# Patient Record
Sex: Male | Born: 1950 | Race: White | Hispanic: No | Marital: Married | Smoking: Former smoker
Health system: Southern US, Community
[De-identification: ages and names within clinical notes are randomized; demographics above are authoritative.]

## PROBLEM LIST (undated history)

## (undated) DIAGNOSIS — M1990 Unspecified osteoarthritis, unspecified site: Secondary | ICD-10-CM

## (undated) DIAGNOSIS — K219 Gastro-esophageal reflux disease without esophagitis: Secondary | ICD-10-CM

## (undated) DIAGNOSIS — I10 Essential (primary) hypertension: Secondary | ICD-10-CM

## (undated) HISTORY — PX: KNEE ARTHROSCOPY: SHX127

## (undated) HISTORY — PX: COLONOSCOPY: SHX174

## (undated) HISTORY — PX: CARPAL TUNNEL RELEASE: SHX101

---

## 2007-11-27 ENCOUNTER — Ambulatory Visit: Payer: Self-pay | Admitting: Unknown Physician Specialty

## 2010-12-05 ENCOUNTER — Ambulatory Visit: Payer: Self-pay | Admitting: Family Medicine

## 2012-02-17 ENCOUNTER — Ambulatory Visit: Payer: Self-pay | Admitting: Unknown Physician Specialty

## 2012-02-20 ENCOUNTER — Ambulatory Visit: Payer: Self-pay | Admitting: Unknown Physician Specialty

## 2012-12-31 ENCOUNTER — Ambulatory Visit: Payer: Self-pay | Admitting: Unknown Physician Specialty

## 2013-01-22 ENCOUNTER — Ambulatory Visit: Payer: Self-pay | Admitting: Unknown Physician Specialty

## 2013-03-01 ENCOUNTER — Ambulatory Visit: Payer: Self-pay | Admitting: Unknown Physician Specialty

## 2014-03-25 ENCOUNTER — Ambulatory Visit: Payer: Self-pay | Admitting: Gastroenterology

## 2014-03-26 LAB — PATHOLOGY REPORT

## 2015-03-01 NOTE — Op Note (Signed)
PATIENT NAMClover Trujillo:  Kurt Trujillo, Kurt Trujillo MR#:  [id number] DATE OF BIRTH:  19-Mar-1951  DATE OF PROCEDURE:  02/20/2012  PREOPERATIVE DIAGNOSIS: Carpal tunnel syndrome, right wrist.   POSTOPERATIVE DIAGNOSIS: Carpal tunnel syndrome, right wrist.   PROCEDURE PERFORMED: Open carpal tunnel release on the right.   SURGEON: Alda BertholdHarold B. Jadis Mika, Jr., M.D.   ANESTHESIA: General.   HISTORY: The patient had a long history of carpal tunnel-type symptoms referable to both wrists. The right had been more involved. His nerve conduction studies were consistent with moderate involvement of his carpal tunnel on the right and mild involvement on the left.   The patient was ultimately brought in for open carpal tunnel release on the right due to his failure to respond to conservative treatment.   DESCRIPTION OF PROCEDURE: The patient was taken to the operating room where a tourniquet was applied to the patient's right upper arm. Satisfactory general anesthesia was achieved. The right upper extremity was then prepped and draped in the usual fashion for a procedure about the wrist.   The right upper extremity was exsanguinated and the tourniquet was inflated. About a 2.5 cm incision was made over the volar aspect of the distal half of his transverse carpal ligament. Dissection was carried down through the subcutaneous tissue onto his palmar fascia. The distal edge of the transverse carpal ligament was identified and a hemostat was inserted underneath the ligament to protect the median nerve. I then divided the ligament in its entirety. The distal half was divided with a knife and the proximal half with scissors. Inspection of the carpal tunnel after release failed to reveal any mass or evidence for proliferative synovitis.   The tourniquet was released at this time. It had been up about 9 to 10 minutes. Bleeding was controlled with digital pressure. The wound was irrigated with GU irrigant. I did inject the wound edges with 7 to 8  mL of 0.5% Marcaine without epinephrine.   I then closed the wound with 4-0 nylon sutures in vertical mattress fashion. Betadine was applied to the wound followed by a sterile dressing. A fiberglass volar splint was applied to wrist and slightly dorsiflexed.   The patient was then awakened and transferred to his stretcher bed. He was taken to the recovery room in satisfactory condition. Blood loss was negligible. ____________________________ Alda BertholdHarold B. Bettymae Yott Jr., MD hbk:slb D: 02/20/2012 13:13:33 ET T: 02/20/2012 16:25:42 ET JOB#: [id number]  cc: Alda BertholdHarold B. Terrisa Curfman Jr., MD, <Dictator> Randon GoldsmithHAROLD B Laurens Matheny, Montez HagemanJR MD ELECTRONICALLY SIGNED 02/26/2012 21:32

## 2015-11-17 ENCOUNTER — Encounter: Payer: Self-pay | Admitting: *Deleted

## 2015-11-17 ENCOUNTER — Other Ambulatory Visit: Payer: Medicare Other

## 2015-11-17 NOTE — Patient Instructions (Signed)
  Your procedure is scheduled on: 11-25-15 [facility](WEDNESDAY) Report to [facility] SAME DAY SURGERY 2ND FLOOR To find out your arrival time please call [phone] between 1PM - 3PM on 11-24-15 (TUESDAY)  Remember: Instructions that are not followed completely may result in serious medical risk, up to and including death, or upon the discretion of your surgeon and anesthesiologist your surgery may need to be rescheduled.    _X___ 1. Do not eat food or drink liquids after midnight. No gum chewing or hard candies.     _X___ 2. No Alcohol for 24 hours before or after surgery.   ____ 3. Bring all medications with you on the day of surgery if instructed.    _X___ 4. Notify your doctor if there is any change in your medical condition     (cold, fever, infections).     Do not wear jewelry, make-up, hairpins, clips or nail polish.  Do not wear lotions, powders, or perfumes. You may wear deodorant.  Do not shave 48 hours prior to surgery. Men may shave face and neck.  Do not bring valuables to the hospital.    [facility] any belongings or valuables.               Contacts, dentures or bridgework may not be worn into surgery.  Leave your suitcase in the car. After surgery it may be brought to your room.  For patients admitted to the hospital, discharge time is determined by your  treatment team.   Patients discharged the day of surgery will not be allowed to drive home.   Please read over the following fact sheets that you were given:      _X___ Take these medicines the morning of surgery with A SIP OF WATER:    1. NIEFEDIPINE  2.   3.   4.  5.  6.  ____ Fleet Enema (as directed)   _X___ Use CHG Soap as directed  ____ Use inhalers on the day of surgery  ____ Stop metformin 2 days prior to surgery    ____ Take 1/2 of usual insulin dose the night before surgery and none on the morning of surgery.   ____ Stop Coumadin/Plavix/aspirin-PT STOPPED ASPIRIN ON  11-15-15  ____ Stop Anti-inflammatories-NO NSAIDS OR ASA PRODUCTS-TYLENOL OK TO TAKE   ____ Stop supplements until after surgery.    ____ Bring C-Pap to the hospital.

## 2015-11-18 ENCOUNTER — Other Ambulatory Visit: Payer: Medicare Other

## 2015-11-19 ENCOUNTER — Encounter
Admission: RE | Admit: 2015-11-19 | Discharge: 2015-11-19 | Disposition: A | Discharge status: Home / Self Care | Payer: Medicare Other | Source: Ambulatory Visit | Attending: Unknown Physician Specialty | Admitting: Unknown Physician Specialty

## 2015-11-19 DIAGNOSIS — Z01810 Encounter for preprocedural cardiovascular examination: Secondary | ICD-10-CM | POA: Insufficient documentation

## 2015-11-25 ENCOUNTER — Ambulatory Visit
Admission: RE | Admit: 2015-11-25 | Discharge: 2015-11-25 | Disposition: A | Discharge status: Home / Self Care | Payer: Medicare Other | Source: Ambulatory Visit | Attending: Unknown Physician Specialty | Admitting: Unknown Physician Specialty

## 2015-11-25 ENCOUNTER — Ambulatory Visit: Payer: Medicare Other | Admitting: Certified Registered Nurse Anesthetist

## 2015-11-25 ENCOUNTER — Encounter
Admission: RE | Disposition: A | Discharge status: Home / Self Care | Payer: Self-pay | Source: Ambulatory Visit | Attending: Unknown Physician Specialty

## 2015-11-25 DIAGNOSIS — Z87891 Personal history of nicotine dependence: Secondary | ICD-10-CM | POA: Insufficient documentation

## 2015-11-25 DIAGNOSIS — Z7982 Long term (current) use of aspirin: Secondary | ICD-10-CM | POA: Diagnosis not present

## 2015-11-25 DIAGNOSIS — I10 Essential (primary) hypertension: Secondary | ICD-10-CM | POA: Diagnosis not present

## 2015-11-25 DIAGNOSIS — G5602 Carpal tunnel syndrome, left upper limb: Secondary | ICD-10-CM | POA: Insufficient documentation

## 2015-11-25 DIAGNOSIS — Z86010 Personal history of colonic polyps: Secondary | ICD-10-CM | POA: Diagnosis not present

## 2015-11-25 DIAGNOSIS — Z79899 Other long term (current) drug therapy: Secondary | ICD-10-CM | POA: Insufficient documentation

## 2015-11-25 HISTORY — DX: Essential (primary) hypertension: I10

## 2015-11-25 HISTORY — PX: CARPAL TUNNEL RELEASE: SHX101

## 2015-11-25 SURGERY — CARPAL TUNNEL RELEASE
Anesthesia: General | Laterality: Left

## 2015-11-25 MED ORDER — MIDAZOLAM HCL 2 MG/2ML IJ SOLN
INTRAMUSCULAR | Status: DC | PRN
  Administered 2015-11-25: 2 mg via INTRAVENOUS

## 2015-11-25 MED ORDER — FAMOTIDINE 20 MG PO TABS
20.0000 mg | ORAL_TABLET | Freq: Once | ORAL | Status: AC
  Administered 2015-11-25: 20 mg via ORAL

## 2015-11-25 MED ORDER — OXYCODONE HCL 5 MG PO TABS: 5.0000 mg | ORAL_TABLET | Freq: Once | ORAL | Status: DC | PRN

## 2015-11-25 MED ORDER — ONDANSETRON HCL 4 MG/2ML IJ SOLN
INTRAMUSCULAR | Status: DC | PRN
  Administered 2015-11-25: 4 mg via INTRAVENOUS

## 2015-11-25 MED ORDER — LACTATED RINGERS IV SOLN
INTRAVENOUS | Status: DC
  Administered 2015-11-25: 11:00:00 via INTRAVENOUS

## 2015-11-25 MED ORDER — BUPIVACAINE HCL (PF) 0.5 % IJ SOLN
INTRAMUSCULAR | Status: DC | PRN
  Administered 2015-11-25: 6 mL

## 2015-11-25 MED ORDER — HYDROCODONE-ACETAMINOPHEN 5-325 MG PO TABS: 1.0000 | ORAL_TABLET | ORAL | Status: DC | PRN

## 2015-11-25 MED ORDER — FENTANYL CITRATE (PF) 100 MCG/2ML IJ SOLN
INTRAMUSCULAR | Status: DC | PRN
  Administered 2015-11-25 (×2): 50 ug via INTRAVENOUS

## 2015-11-25 MED ORDER — OXYCODONE HCL 5 MG/5ML PO SOLN: 5.0000 mg | Freq: Once | ORAL | Status: DC | PRN

## 2015-11-25 MED ORDER — BUPIVACAINE HCL (PF) 0.5 % IJ SOLN
INTRAMUSCULAR | Status: AC
  Filled 2015-11-25: qty 30

## 2015-11-25 MED ORDER — FENTANYL CITRATE (PF) 100 MCG/2ML IJ SOLN: 25.0000 ug | INTRAMUSCULAR | Status: DC | PRN

## 2015-11-25 MED ORDER — FAMOTIDINE 20 MG PO TABS
ORAL_TABLET | ORAL | Status: AC
  Administered 2015-11-25: 20 mg via ORAL
  Filled 2015-11-25: qty 1

## 2015-11-25 MED ORDER — PROPOFOL 10 MG/ML IV BOLUS
INTRAVENOUS | Status: DC | PRN
  Administered 2015-11-25: 200 mg via INTRAVENOUS

## 2015-11-25 SURGICAL SUPPLY — 24 items
BANDAGE ELASTIC 2 CLIP NS LF (GAUZE/BANDAGES/DRESSINGS) ×3 IMPLANT
BNDG ESMARK 4X12 TAN STRL LF (GAUZE/BANDAGES/DRESSINGS) ×3 IMPLANT
CHLORAPREP W/TINT 26ML (MISCELLANEOUS) ×3 IMPLANT
CUFF TOURN 18 STER (MISCELLANEOUS) ×3 IMPLANT
ELECT REM PT RETURN 9FT ADLT (ELECTROSURGICAL) ×3
ELECTRODE REM PT RTRN 9FT ADLT (ELECTROSURGICAL) ×1 IMPLANT
GAUZE SPONGE 4X4 12PLY STRL (GAUZE/BANDAGES/DRESSINGS) ×3 IMPLANT
GLOVE BIO SURGEON STRL SZ7.5 (GLOVE) ×3 IMPLANT
GLOVE BIO SURGEON STRL SZ8 (GLOVE) ×6 IMPLANT
GLOVE BIOGEL M STRL SZ7.5 (GLOVE) ×3 IMPLANT
GLOVE INDICATOR 8.0 STRL GRN (GLOVE) ×3 IMPLANT
GOWN STRL REUS W/ TWL LRG LVL3 (GOWN DISPOSABLE) ×2 IMPLANT
GOWN STRL REUS W/TWL LRG LVL3 (GOWN DISPOSABLE) ×4
NS IRRIG 500ML POUR BTL (IV SOLUTION) ×3 IMPLANT
PACK EXTREMITY ARMC (MISCELLANEOUS) ×3 IMPLANT
PADDING CAST 2X4YD ST (MISCELLANEOUS) ×2
PADDING CAST BLEND 2X4 STRL (MISCELLANEOUS) ×1 IMPLANT
SOL PREP PVP 2OZ (MISCELLANEOUS) ×3
SOLUTION PREP PVP 2OZ (MISCELLANEOUS) ×1 IMPLANT
SPLINT CAST 1 STEP 3X12 (MISCELLANEOUS) ×3 IMPLANT
STOCKINETTE STRL 4IN 9604848 (GAUZE/BANDAGES/DRESSINGS) ×3 IMPLANT
SUT ETHILON 4-0 (SUTURE) ×2
SUT ETHILON 4-0 FS2 18XMFL BLK (SUTURE) ×1
SUTURE ETHLN 4-0 FS2 18XMF BLK (SUTURE) ×1 IMPLANT

## 2015-11-25 NOTE — Anesthesia Preprocedure Evaluation (Signed)
Anesthesia Evaluation  Patient identified by MRN, date of birth, ID band Patient awake    Reviewed: Allergy & Precautions, H&P , NPO status , Patient's Chart, lab work & pertinent test results  History of Anesthesia Complications Negative for: history of anesthetic complications  Airway Mallampati: II  TM Distance: >3 FB Neck ROM: limited    Dental  (+) Poor Dentition, Chipped   Pulmonary neg shortness of breath, former smoker,    Pulmonary exam normal breath sounds clear to auscultation       Cardiovascular Exercise Tolerance: Good hypertension, (-) angina(-) Past MI and (-) DOE Normal cardiovascular exam Rhythm:regular Rate:Normal     Neuro/Psych negative neurological ROS  negative psych ROS   GI/Hepatic negative GI ROS, Neg liver ROS, neg GERD  ,  Endo/Other  negative endocrine ROS  Renal/GU negative Renal ROS  negative genitourinary   Musculoskeletal   Abdominal   Peds  Hematology negative hematology ROS (+)   Anesthesia Other Findings Past Medical History:   Hypertension                                                Past Surgical History:   COLONOSCOPY                                                   CARPAL TUNNEL RELEASE                           Right              KNEE ARTHROSCOPY                                Right             BMI    Body Mass Index   36.01 kg/m 2    Signs and symptoms suggestive of sleep apnea     Reproductive/Obstetrics negative OB ROS                             Anesthesia Physical Anesthesia Plan  ASA: III  Anesthesia Plan: General LMA   Post-op Pain Management:    Induction:   Airway Management Planned:   Additional Equipment:   Intra-op Plan:   Post-operative Plan:   Informed Consent: I have reviewed the patients History and Physical, chart, labs and discussed the procedure including the risks, benefits and alternatives for the  proposed anesthesia with the patient or authorized representative who has indicated his/her understanding and acceptance.   Dental Advisory Given  Plan Discussed with: Anesthesiologist, CRNA and Surgeon  Anesthesia Plan Comments:         Anesthesia Quick Evaluation

## 2015-11-25 NOTE — Op Note (Signed)
DATE OF SURGERY:  11/25/2015  PATIENT NAME:  Kurt Trujillo   DOB: 1950/11/14  MRN: [id number]  PRE-OPERATIVE DIAGNOSIS: Left carpal tunnel syndrome  POST-OPERATIVE DIAGNOSIS:  Same  PROCEDURE: Left carpal tunnel release  SURGEON: Dr. Erin Sons, Montez Hageman. M.D.  ANESTHESIA: Gen.   INDICATIONS FOR SURGERY: Kurt Trujillo is a 65 y.o. year old male with a long history of numbness and paresthesias in the left hand. Nerve conduction studies demonstrated findings consistent with severe  median nerve compression.The patient had not seen any significant improvement despite conservative nonsurgical intervention. After discussion of the risks and benefits of surgical intervention, the patient expressed understanding of the risks benefits and agree with plans for carpal tunnel release.   PROCEDURE IN DETAIL: The patient was brought into the operating room and then satisfactory general anesthesia was achieved. A tourniquet was placed on the patient's left upper arm.The left hand and arm were prepped  and draped in the usual sterile fashion. A "time-out" was performed as per usual protocol. The hand and forearm were exsanguinated using an Esmarch and the tourniquet was inflated to 250 mmHg.  An incision was made just ulnar to the thenar palmar crease. Dissection was carried down through the palmar fascia to the transverse carpal ligament. The transverse carpal ligament was sharply incised, taking care to protect the underlying structures within the carpal tunnel. Complete release of the transverse carpal ligament was achieved. There was no evidence of a mass or proliferative synovitis within the carpal tunnel. The patient's median nerve was quite flattened, however. The wound was irrigated with saline. The tourniquet was released at this time. It had been up for about 11 minutes. Bleeding was controlled with coagulation cautery. I did inject the subcutaneous tissue of the wound with about 10 cc of 0.5% Marcaine  without epinephrine. The skin was then re-approximated with interrupted sutures of #4-0 nylon. A sterile dressing was applied followed by application of a volar splint.  The patient tolerated the procedure well and was transported to the PACU in stable condition.  Dr. Isidoro Donning. M.D.

## 2015-11-25 NOTE — Anesthesia Postprocedure Evaluation (Signed)
Anesthesia Post Note  Patient: Kurt Trujillo  Procedure(s) Performed: Procedure(s) (LRB): CARPAL TUNNEL RELEASE (Left)  Patient location during evaluation: PACU Anesthesia Type: General Level of consciousness: awake and alert Pain management: pain level controlled Vital Signs Assessment: post-procedure vital signs reviewed and stable Respiratory status: spontaneous breathing, nonlabored ventilation, respiratory function stable and patient connected to nasal cannula oxygen Cardiovascular status: blood pressure returned to baseline and stable Postop Assessment: no signs of nausea or vomiting Anesthetic complications: no    Last Vitals:  Filed Vitals:   11/25/15 1250 11/25/15 1305  BP: 130/91 132/90  Pulse: 64 62  Temp:    Resp: 13 14    Last Pain: There were no vitals filed for this visit.               Cleda Mccreedy Piscitello

## 2015-11-25 NOTE — Anesthesia Procedure Notes (Signed)
Procedure Name: LMA Insertion Performed by: Almeta Monas Pre-anesthesia Checklist: Patient identified, Patient being monitored, Timeout performed, Emergency Drugs available and Suction available Patient Re-evaluated:Patient Re-evaluated prior to inductionOxygen Delivery Method: Circle system utilized Preoxygenation: Pre-oxygenation with 100% oxygen Intubation Type: IV induction LMA: LMA inserted LMA Size: 5.0 Tube type: Oral Number of attempts: 1 Placement Confirmation: positive ETCO2 and breath sounds checked- equal and bilateral Tube secured with: Tape Dental Injury: Teeth and Oropharynx as per pre-operative assessment

## 2015-11-25 NOTE — H&P (Signed)
  H and P reviewed. No changes. Uploaded at later date. 

## 2015-11-25 NOTE — Discharge Instructions (Signed)
Ice pack  Elevation  Keep dressing dry   RTC in about 10 days

## 2015-11-25 NOTE — Transfer of Care (Signed)
Immediate Anesthesia Transfer of Care Note  Patient: Kurt Trujillo  Procedure(s) Performed: Procedure(s): CARPAL TUNNEL RELEASE (Left)  Patient Location: PACU  Anesthesia Type:General  Level of Consciousness: awake, alert  and oriented  Airway & Oxygen Therapy: Patient Spontanous Breathing  Post-op Assessment: Report given to RN and Post -op Vital signs reviewed and stable  Post vital signs: Reviewed and stable  Last Vitals:  Filed Vitals:   11/25/15 1013 11/25/15 1235  BP: 135/87 148/98  Pulse: 83 82  Temp: 36 C 36.5 C  Resp: 16 22    Complications: No apparent anesthesia complications

## 2017-09-07 ENCOUNTER — Other Ambulatory Visit: Payer: Self-pay | Admitting: Unknown Physician Specialty

## 2017-09-07 DIAGNOSIS — M25511 Pain in right shoulder: Secondary | ICD-10-CM

## 2017-09-13 ENCOUNTER — Ambulatory Visit
Admission: RE | Admit: 2017-09-13 | Discharge: 2017-09-13 | Disposition: A | Discharge status: Home / Self Care | Payer: Medicare Other | Source: Ambulatory Visit | Attending: Unknown Physician Specialty | Admitting: Unknown Physician Specialty

## 2017-09-13 DIAGNOSIS — M25511 Pain in right shoulder: Secondary | ICD-10-CM

## 2017-09-13 DIAGNOSIS — M75121 Complete rotator cuff tear or rupture of right shoulder, not specified as traumatic: Secondary | ICD-10-CM | POA: Insufficient documentation

## 2017-09-13 DIAGNOSIS — M19011 Primary osteoarthritis, right shoulder: Secondary | ICD-10-CM | POA: Diagnosis not present

## 2017-09-13 DIAGNOSIS — M7551 Bursitis of right shoulder: Secondary | ICD-10-CM | POA: Diagnosis not present

## 2017-09-22 ENCOUNTER — Other Ambulatory Visit: Payer: Self-pay | Admitting: Orthopedic Surgery

## 2017-09-22 DIAGNOSIS — M25511 Pain in right shoulder: Secondary | ICD-10-CM

## 2017-10-02 ENCOUNTER — Ambulatory Visit: Payer: Medicare Other

## 2017-10-03 ENCOUNTER — Ambulatory Visit: Payer: Medicare Other

## 2017-11-01 ENCOUNTER — Encounter
Admission: RE | Admit: 2017-11-01 | Discharge: 2017-11-01 | Disposition: A | Discharge status: Home / Self Care | Payer: Medicare Other | Source: Ambulatory Visit | Attending: Surgery | Admitting: Surgery

## 2017-11-01 ENCOUNTER — Other Ambulatory Visit: Payer: Self-pay

## 2017-11-01 HISTORY — DX: Gastro-esophageal reflux disease without esophagitis: K21.9

## 2017-11-01 HISTORY — DX: Unspecified osteoarthritis, unspecified site: M19.90

## 2017-11-01 NOTE — Patient Instructions (Signed)
  Your procedure is scheduled on: 11-09-16 THURSDAY Report to Same Day Surgery 2nd floor medical mall [facility](Medical Mall Entrance-take elevator on left to 2nd floor.  Check in with surgery information desk.) To find out your arrival time please call [phone]) [phone] between 1PM - 3PM on 11-08-16 [facility]  Remember: Instructions that are not followed completely may result in serious medical risk, up to and including death, or upon the discretion of your surgeon and anesthesiologist your surgery may need to be rescheduled.    _x___ 1. Do not eat food after midnight the night before your procedure. NO GUM OR CANDY AFTER MIDNIGHT.  You may drink clear liquids up to 2 hours before you are scheduled to arrive at the hospital for your procedure.  Do not drink clear liquids within 2 hours of your scheduled arrival to the hospital.  Clear liquids include  --Water or Apple juice without pulp  --Clear carbohydrate beverage such as ClearFast or Gatorade  --Black Coffee or Clear Tea (No milk, no creamers, do not add anything to  the coffee or Tea    __x__ 2. No Alcohol for 24 hours before or after surgery.   __x__3. No Smoking for 24 prior to surgery.   ____  4. Bring all medications with you on the day of surgery if instructed.    __x__ 5. Notify your doctor if there is any change in your medical condition     (cold, fever, infections).     Do not wear jewelry, make-up, hairpins, clips or nail polish.  Do not wear lotions, powders, or perfumes. You may wear deodorant.  Do not shave 48 hours prior to surgery. Men may shave face and neck.  Do not bring valuables to the hospital.    [facility] not [facility] for any belongings or valuables.               Contacts, dentures or bridgework may not be worn into surgery.  Leave your suitcase in the car. After surgery it may be brought to your room.  For patients admitted to the hospital, discharge time is determined by your treatment team.   Patients  discharged the day of surgery will not be allowed to drive home.  You will need someone to drive you home and stay with you the night of your procedure.    Please read over the following fact sheets that you were given:   [facility] Pre[facility] and or MRSA Information   _x___ TAKE THE FOLLOWING MEDICATION THE MORNING OF SURGERY WITH A SMALL SIP OF WATER. These include:  1. PROCARDIA (NIFEDIPINE)  2.  3.  4.  5.  6.  ____Fleets enema or Magnesium Citrate as directed.   _x___ Use CHG Soap or sage wipes as directed on instruction sheet   ____ Use inhalers on the day of surgery and bring to hospital day of surgery  ____ Stop Metformin and Janumet 2 days prior to surgery.    ____ Take 1/2 of usual insulin dose the night before surgery and none on the morning surgery.   _x___ Follow recommendations from Cardiologist, Pulmonologist or PCP regarding stopping Aspirin, Coumadin, Plavix ,Eliquis, Effient, or Pradaxa, and Pletal-STOP ASPIRIN NOW  X____Stop Anti-inflammatories such as Advil, ALEVE, Ibuprofen, Motrin, Naproxen, Naprosyn, Goodies powders or aspirin products NOW-OK to take Tylenol    ____ Stop supplements until after surgery.    ____ Bring C-Pap to the hospital.

## 2017-11-02 ENCOUNTER — Encounter
Admission: RE | Admit: 2017-11-02 | Discharge: 2017-11-02 | Disposition: A | Discharge status: Home / Self Care | Payer: Medicare Other | Source: Ambulatory Visit | Attending: Surgery | Admitting: Surgery

## 2017-11-02 DIAGNOSIS — R9431 Abnormal electrocardiogram [ECG] [EKG]: Secondary | ICD-10-CM | POA: Insufficient documentation

## 2017-11-02 DIAGNOSIS — I10 Essential (primary) hypertension: Secondary | ICD-10-CM | POA: Diagnosis present

## 2017-11-08 MED ORDER — CEFAZOLIN SODIUM-DEXTROSE 2-4 GM/100ML-% IV SOLN
2.0000 g | Freq: Once | INTRAVENOUS | Status: AC
  Administered 2017-11-09: 2 g via INTRAVENOUS

## 2017-11-09 ENCOUNTER — Encounter
Admission: RE | Disposition: A | Discharge status: Home / Self Care | Payer: Self-pay | Source: Ambulatory Visit | Attending: Surgery

## 2017-11-09 ENCOUNTER — Other Ambulatory Visit: Payer: Self-pay

## 2017-11-09 ENCOUNTER — Ambulatory Visit
Admission: RE | Admit: 2017-11-09 | Discharge: 2017-11-09 | Disposition: A | Discharge status: Home / Self Care | Payer: Medicare Other | Source: Ambulatory Visit | Attending: Surgery | Admitting: Surgery

## 2017-11-09 ENCOUNTER — Ambulatory Visit: Payer: Medicare Other | Admitting: Anesthesiology

## 2017-11-09 ENCOUNTER — Encounter: Payer: Self-pay | Admitting: *Deleted

## 2017-11-09 DIAGNOSIS — I10 Essential (primary) hypertension: Secondary | ICD-10-CM | POA: Insufficient documentation

## 2017-11-09 DIAGNOSIS — M75101 Unspecified rotator cuff tear or rupture of right shoulder, not specified as traumatic: Secondary | ICD-10-CM | POA: Diagnosis not present

## 2017-11-09 DIAGNOSIS — Z87891 Personal history of nicotine dependence: Secondary | ICD-10-CM | POA: Diagnosis not present

## 2017-11-09 DIAGNOSIS — M13811 Other specified arthritis, right shoulder: Secondary | ICD-10-CM | POA: Insufficient documentation

## 2017-11-09 DIAGNOSIS — K219 Gastro-esophageal reflux disease without esophagitis: Secondary | ICD-10-CM | POA: Diagnosis not present

## 2017-11-09 DIAGNOSIS — Z7982 Long term (current) use of aspirin: Secondary | ICD-10-CM | POA: Insufficient documentation

## 2017-11-09 DIAGNOSIS — Z79899 Other long term (current) drug therapy: Secondary | ICD-10-CM | POA: Insufficient documentation

## 2017-11-09 DIAGNOSIS — M7521 Bicipital tendinitis, right shoulder: Secondary | ICD-10-CM | POA: Diagnosis not present

## 2017-11-09 SURGERY — SHOULDER ARTHROSCOPY WITH SUBACROMIAL DECOMPRESSION AND DISTAL CLAVICLE EXCISION
Anesthesia: General | Laterality: Right

## 2017-11-09 MED ORDER — ROCURONIUM BROMIDE 50 MG/5ML IV SOLN
INTRAVENOUS | Status: AC
  Filled 2017-11-09: qty 2

## 2017-11-09 MED ORDER — EPINEPHRINE PF 1 MG/ML IJ SOLN
INTRAMUSCULAR | Status: DC | PRN
  Administered 2017-11-09 (×2): 1 mg

## 2017-11-09 MED ORDER — LIDOCAINE HCL (PF) 2 % IJ SOLN
INTRAMUSCULAR | Status: AC
  Filled 2017-11-09: qty 10

## 2017-11-09 MED ORDER — MIDAZOLAM HCL 2 MG/2ML IJ SOLN
2.0000 mg | Freq: Once | INTRAMUSCULAR | Status: AC
  Administered 2017-11-09: 2 mg via INTRAVENOUS

## 2017-11-09 MED ORDER — LIDOCAINE HCL (PF) 1 % IJ SOLN
INTRAMUSCULAR | Status: DC | PRN
  Administered 2017-11-09: .8 mL via SUBCUTANEOUS

## 2017-11-09 MED ORDER — FENTANYL CITRATE (PF) 100 MCG/2ML IJ SOLN
INTRAMUSCULAR | Status: DC | PRN
  Administered 2017-11-09: 75 ug via INTRAVENOUS
  Administered 2017-11-09: 100 ug via INTRAVENOUS
  Administered 2017-11-09: 75 ug via INTRAVENOUS

## 2017-11-09 MED ORDER — FENTANYL CITRATE (PF) 100 MCG/2ML IJ SOLN: 25.0000 ug | INTRAMUSCULAR | Status: DC | PRN

## 2017-11-09 MED ORDER — ROPIVACAINE HCL 5 MG/ML IJ SOLN
INTRAMUSCULAR | Status: DC | PRN
  Administered 2017-11-09: 30 mL via PERINEURAL

## 2017-11-09 MED ORDER — BUPIVACAINE-EPINEPHRINE (PF) 0.5% -1:200000 IJ SOLN
INTRAMUSCULAR | Status: DC | PRN
  Administered 2017-11-09: 30 mL via PERINEURAL

## 2017-11-09 MED ORDER — FAMOTIDINE 20 MG PO TABS
20.0000 mg | ORAL_TABLET | Freq: Once | ORAL | Status: AC
  Administered 2017-11-09: 20 mg via ORAL

## 2017-11-09 MED ORDER — LIDOCAINE HCL (CARDIAC) 20 MG/ML IV SOLN
INTRAVENOUS | Status: DC | PRN
  Administered 2017-11-09: 100 mg via INTRAVENOUS

## 2017-11-09 MED ORDER — PROPOFOL 10 MG/ML IV BOLUS
INTRAVENOUS | Status: DC | PRN
  Administered 2017-11-09: 30 mg via INTRAVENOUS
  Administered 2017-11-09: 170 mg via INTRAVENOUS

## 2017-11-09 MED ORDER — PROPOFOL 10 MG/ML IV BOLUS
INTRAVENOUS | Status: AC
  Filled 2017-11-09: qty 20

## 2017-11-09 MED ORDER — LACTATED RINGERS IV SOLN
INTRAVENOUS | Status: DC
  Administered 2017-11-09: 11:00:00 via INTRAVENOUS

## 2017-11-09 MED ORDER — FENTANYL CITRATE (PF) 100 MCG/2ML IJ SOLN
50.0000 ug | Freq: Once | INTRAMUSCULAR | Status: AC
  Administered 2017-11-09: 50 ug via INTRAVENOUS

## 2017-11-09 MED ORDER — FENTANYL CITRATE (PF) 250 MCG/5ML IJ SOLN
INTRAMUSCULAR | Status: AC
  Filled 2017-11-09: qty 5

## 2017-11-09 MED ORDER — SUGAMMADEX SODIUM 200 MG/2ML IV SOLN
INTRAVENOUS | Status: DC | PRN
  Administered 2017-11-09: 170 mg via INTRAVENOUS

## 2017-11-09 MED ORDER — DEXAMETHASONE SODIUM PHOSPHATE 4 MG/ML IJ SOLN
INTRAMUSCULAR | Status: DC | PRN
  Administered 2017-11-09: 6 mg via INTRAVENOUS

## 2017-11-09 MED ORDER — DEXMEDETOMIDINE HCL 200 MCG/2ML IV SOLN
INTRAVENOUS | Status: DC | PRN
  Administered 2017-11-09: 8 ug via INTRAVENOUS

## 2017-11-09 MED ORDER — ONDANSETRON HCL 4 MG/2ML IJ SOLN: 4.0000 mg | Freq: Once | INTRAMUSCULAR | Status: DC | PRN

## 2017-11-09 MED ORDER — ROCURONIUM BROMIDE 100 MG/10ML IV SOLN
INTRAVENOUS | Status: DC | PRN
  Administered 2017-11-09: 20 mg via INTRAVENOUS
  Administered 2017-11-09: 50 mg via INTRAVENOUS

## 2017-11-09 MED ORDER — OXYCODONE HCL 5 MG PO TABS: 5.0000 mg | ORAL_TABLET | ORAL | 0 refills | Status: AC | PRN

## 2017-11-09 SURGICAL SUPPLY — 47 items
BIT DRILL JUGRKNT W/NDL BIT2.9 (DRILL) ×2 IMPLANT
BLADE FULL RADIUS 3.5 (BLADE) ×3 IMPLANT
BUR ACROMIONIZER 4.0 (BURR) ×3 IMPLANT
CANNULA SHAVER 8MMX76MM (CANNULA) ×3 IMPLANT
CHLORAPREP W/TINT 26ML (MISCELLANEOUS) ×3 IMPLANT
COVER MAYO STAND STRL (DRAPES) IMPLANT
DRAPE IMP U-DRAPE 54X76 (DRAPES) ×6 IMPLANT
DRAPE POUCH INSTRU U-SHP 10X18 (DRAPES) ×3 IMPLANT
DRILL JUGGERKNOT W/NDL BIT 2.9 (DRILL) ×6
DRSG OPSITE POSTOP 4X8 (GAUZE/BANDAGES/DRESSINGS) ×3 IMPLANT
ELECT REM PT RETURN 9FT ADLT (ELECTROSURGICAL) ×3
ELECTRODE REM PT RTRN 9FT ADLT (ELECTROSURGICAL) ×1 IMPLANT
GAUZE PETRO XEROFOAM 1X8 (MISCELLANEOUS) ×3 IMPLANT
GAUZE SPONGE 4X4 12PLY STRL (GAUZE/BANDAGES/DRESSINGS) ×3 IMPLANT
GLOVE BIO SURGEON STRL SZ7.5 (GLOVE) ×6 IMPLANT
GLOVE BIO SURGEON STRL SZ8 (GLOVE) ×6 IMPLANT
GLOVE BIOGEL PI IND STRL 8 (GLOVE) ×1 IMPLANT
GLOVE BIOGEL PI INDICATOR 8 (GLOVE) ×2
GLOVE INDICATOR 8.0 STRL GRN (GLOVE) ×3 IMPLANT
GOWN STRL REUS W/ TWL LRG LVL3 (GOWN DISPOSABLE) ×1 IMPLANT
GOWN STRL REUS W/ TWL XL LVL3 (GOWN DISPOSABLE) ×1 IMPLANT
GOWN STRL REUS W/TWL LRG LVL3 (GOWN DISPOSABLE) ×2
GOWN STRL REUS W/TWL XL LVL3 (GOWN DISPOSABLE) ×2
GRASPER SUT 15 45D LOW PRO (SUTURE) IMPLANT
IV LACTATED RINGER IRRG 3000ML (IV SOLUTION) ×4
IV LR IRRIG 3000ML ARTHROMATIC (IV SOLUTION) ×2 IMPLANT
MANIFOLD NEPTUNE II (INSTRUMENTS) ×3 IMPLANT
MASK FACE SPIDER DISP (MASK) ×3 IMPLANT
MAT BLUE FLOOR 46X72 FLO (MISCELLANEOUS) ×3 IMPLANT
NDL MAYO CATGUT SZ5 (NEEDLE)
NDL SUT 5 .5 CRC TPR PNT MAYO (NEEDLE) IMPLANT
NEEDLE HYPO 22GX1.5 SAFETY (NEEDLE) ×3 IMPLANT
NEEDLE MAYO 6 CRC TAPER PT (NEEDLE) IMPLANT
NEEDLE REVERSE CUT 1/2 CRC (NEEDLE) IMPLANT
PACK ARTHROSCOPY SHOULDER (MISCELLANEOUS) ×3 IMPLANT
SLING ARM LRG DEEP (SOFTGOODS) IMPLANT
SLING ULTRA II LG (MISCELLANEOUS) IMPLANT
STAPLER SKIN PROX 35W (STAPLE) ×3 IMPLANT
STRAP SAFETY BODY (MISCELLANEOUS) ×6 IMPLANT
SUT ETHIBOND 0 MO6 C/R (SUTURE) ×3 IMPLANT
SUT VIC AB 2-0 CT1 27 (SUTURE) ×4
SUT VIC AB 2-0 CT1 TAPERPNT 27 (SUTURE) ×2 IMPLANT
TAPE MICROFOAM 4IN (TAPE) ×3 IMPLANT
TUBING ARTHRO INFLOW-ONLY STRL (TUBING) ×3 IMPLANT
TUBING CONNECTING 10 (TUBING) ×2 IMPLANT
TUBING CONNECTING 10' (TUBING) ×1
WAND HAND CNTRL MULTIVAC 90 (MISCELLANEOUS) ×3 IMPLANT

## 2017-11-09 NOTE — Transfer of Care (Signed)
Immediate Anesthesia Transfer of Care Note  Patient: Antony SalmonRonald D Steinberg  Procedure(s) Performed: SHOULDER ARTHROSCOPY WITH DEBRIDEMENT, DECOMPRESSION AND BICEPS TENODESIS (Right )  Patient Location: PACU  Anesthesia Type:General  Level of Consciousness: awake, alert , oriented and patient cooperative  Airway & Oxygen Therapy: Patient Spontanous Breathing and Patient connected to nasal cannula oxygen  Post-op Assessment: Report given to RN and Post -op Vital signs reviewed and stable  Post vital signs: Reviewed and stable  Last Vitals:  Vitals:   11/09/17 1211 11/09/17 1226  BP: 118/76 138/82  Pulse: 64 78  Resp: 17 19  Temp:    SpO2: 96% 98%    Last Pain:  Vitals:   11/09/17 1029  TempSrc: Oral         Complications: No apparent anesthesia complications

## 2017-11-09 NOTE — Anesthesia Postprocedure Evaluation (Signed)
Anesthesia Post Note  Patient: Kurt Trujillo  Procedure(s) Performed: SHOULDER ARTHROSCOPY WITH DEBRIDEMENT, DECOMPRESSION AND BICEPS TENODESIS (Right )  Patient location during evaluation: PACU Anesthesia Type: General Level of consciousness: awake and alert Pain management: pain level controlled Vital Signs Assessment: post-procedure vital signs reviewed and stable Respiratory status: spontaneous breathing and respiratory function stable Cardiovascular status: stable Anesthetic complications: no     Last Vitals:  Vitals:   11/09/17 1517 11/09/17 1612  BP: 127/74 140/74  Pulse: 67 74  Resp: 16 16  Temp: 36.6 C   SpO2: 96% 98%    Last Pain:  Vitals:   11/09/17 1517  TempSrc: Oral                 KEPHART,WILLIAM K

## 2017-11-09 NOTE — Anesthesia Procedure Notes (Signed)
Procedure Name: Intubation Date/Time: 11/09/2017 12:53 PM Performed by: Lily KocherPeralta, Khamari Yousuf, CRNA Pre-anesthesia Checklist: Patient identified, Patient being monitored, Timeout performed, Emergency Drugs available and Suction available Patient Re-evaluated:Patient Re-evaluated prior to induction Oxygen Delivery Method: Circle system utilized Preoxygenation: Pre-oxygenation with 100% oxygen Induction Type: IV induction Ventilation: Mask ventilation without difficulty Laryngoscope Size: Miller and 2 Grade View: Grade II Tube type: Oral Tube size: 7.5 mm Number of attempts: 1 Airway Equipment and Method: Stylet Placement Confirmation: ETT inserted through vocal cords under direct vision,  positive ETCO2 and breath sounds checked- equal and bilateral Secured at: 22 cm Tube secured with: Tape Dental Injury: Teeth and Oropharynx as per pre-operative assessment

## 2017-11-09 NOTE — Anesthesia Post-op Follow-up Note (Signed)
Anesthesia QCDR form completed.        

## 2017-11-09 NOTE — Op Note (Signed)
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  Post-Op Diagnosis: Same.  Procedure: Extensive arthroscopic debridement, arthroscopic subacromial decompression, and mini-open biceps tenodesis, right shoulder.  Anesthesia: General endotracheal with interscalene block placed preoperatively by the anesthesiologist.  Surgeon:   Maryagnes Amos, MD  Assistant:   Horris Latino, PA-C  Findings: As above.  There is a massive irreparable rotator cuff tear involving most of the subscapularis tendon, as well as all of the supraspinatus and infraspinatus tendons, with retraction of the supraspinatus and infraspinatus tendons back to the glenoid.  The biceps tendon is significantly frayed and tendon of pathic with medial subluxation out of the groove.  There are diffuse grade 1-2 chondromalacial changes of the humeral head and grade 2-3 chondral malacia changes of the glenoid.  Moderate labral fraying is noted circumferentially without frank detachment from the glenoid.  Complications: None  Fluids:   750 cc  Estimated blood loss: 5 cc  Tourniquet time: None  Drains: None  Closure: Staples   Brief clinical note: The patient is a 67 year old male with a long history of right shoulder pain. The patient's symptoms have progressed despite medications, activity modification, etc. The patient's history and examination are consistent with impingement/tendinopathy with a massive irreparable rotator cuff tear and biceps tendinopathy. These findings were confirmed by MRI scan. The patient presents at this time for definitive management of these shoulder symptoms.  Procedure: The patient underwent placement of an interscalene block by the anesthesiologist in the preoperative holding area before being brought into the operating room and lain in the supine  position. The patient then underwent general endotracheal intubation and anesthesia before being repositioned in the beach chair position using the beach chair positioner. The right shoulder and upper extremity were prepped with ChloraPrep solution before being draped sterilely. Preoperative antibiotics were administered. A timeout was performed to confirm the proper surgical site before the expected portal sites and incision site were injected with 0.5% Sensorcaine with epinephrine. A posterior portal was created and the glenohumeral joint thoroughly inspected with the findings as described above. An anterior portal was created using an outside-in technique. The labrum and rotator cuff were further probed, again confirming the above-noted findings. The areas of labral fraying were debrided back to stable margins using the full-radius resector. The areas of extensive synovitis also were debrided back to stable margins using the full-radius resector. Finally, there were areas of articular cartilage fraying that were debrided back to stable margins using the full-radius resector. The ArthroCare wand was inserted and used to release the biceps tendon from its labral anchor, as well as obtain hemostasis and to "anneal" the labrum superiorly and anteriorly. The instruments were removed from the joint after suctioning the excess fluid.  The camera was repositioned through the posterior portal into the subacromial space. A separate lateral portal was created using an outside-in technique. The 3.5 mm full-radius resector was introduced and used to perform a subtotal bursectomy. The ArthroCare wand was then inserted and used to remove the periosteal tissue off the undersurface of the anterior third of the acromion as well as to recess the coracoacromial ligament from its attachment along the anterior and lateral margins of the acromion. No significant bony spur was identified, so a formal bony acromioplasty was not  performed. The instruments were then removed from the subacromial space after suctioning the excess fluid.  An  approximately 4-5 cm incision was made over the anterolateral aspect of the shoulder beginning at the anterolateral corner of the acromion and extending distally in line with the bicipital groove. This incision was carried down through the subcutaneous tissues to expose the deltoid fascia. The raphae between the anterior and middle thirds was identified and this plane developed to provide access into the subacromial space. Additional bursal tissues were debrided sharply using Metzenbaum scissors. The rotator cuff tear was readily identified with the findings as described above.  The bicipital groove was identified by palpation and opened for 1-1.5 cm. The biceps tendon stump was retrieved through this defect. The floor of the bicipital groove was roughened with a curet before a single Biomet 2.9 mm JuggerKnot anchor was inserted. Both sets of sutures were passed through the biceps tendon and tied securely to effect the tenodesis. The bicipital sheath was reapproximated using two #0 Ethibond interrupted sutures, incorporating the biceps tendon to further reinforce the tenodesis.  The wound was copiously irrigated with sterile saline solution before the deltoid raphae was reapproximated using 2-0 Vicryl interrupted sutures. The subcutaneous tissues were closed in two layers using 2-0 Vicryl interrupted sutures before the skin was closed using staples. The portal sites also were closed using staples. A sterile bulky dressing was applied to the shoulder before the arm was placed into a shoulder immobilizer. The patient was then awakened, extubated, and returned to the recovery room in satisfactory condition after tolerating the procedure well.

## 2017-11-09 NOTE — Anesthesia Procedure Notes (Signed)
Anesthesia Regional Block: Interscalene brachial plexus block   Pre-Anesthetic Checklist: ,, timeout performed, Correct Patient, Correct Site, Correct Laterality, Correct Procedure, Correct Position, site marked, Risks and benefits discussed,  Surgical consent,  Pre-op evaluation,  At surgeon's request and post-op pain management   Prep: chloraprep       Needles:  Injection technique: Single-shot  Needle Type: Echogenic Stimulator Needle     Needle Length: 9cm  Needle Gauge: 22     Additional Needles:   Procedures:, nerve stimulator,,, ultrasound used (permanent image in chart),,,,   Nerve Stimulator or Paresthesia:  Response: biceps flexion, 0.8 mA,   Additional Responses:   Narrative:  Start time: 11/09/2017 11:25 AM End time: 11/09/2017 11:35 AM Injection made incrementally with aspirations every 5 mL.  Performed by: Personally   Additional Notes: Functioning IV was confirmed and monitors were applied.  A 50mm 22ga Stimuplex needle was used. Sterile prep and drape,hand hygiene and sterile gloves were used.  Negative aspiration and negative test dose prior to incremental administration of local anesthetic. The patient tolerated the procedure well.

## 2017-11-09 NOTE — Discharge Instructions (Addendum)
Keep dressing dry and intact.  May shower after dressing changed on post-op day #4 (Monday).  Cover staples with Band-Aids after drying off. Apply ice frequently to shoulder. Take Aleve 2 tablets BID with meals for 7-10 days, then as necessary. Take oxycodone as prescribed when needed.  May supplement with ES Tylenol if necessary. Keep shoulder immobilizer on at all times except may remove for bathing purposes. Follow-up in 10-14 days or as scheduled.  AMBULATORY SURGERY  DISCHARGE INSTRUCTIONS   1) The drugs that you were given will stay in your system until tomorrow so for the next 24 hours you should not:  A) Drive an automobile B) Make any legal decisions C) Drink any alcoholic beverage   2) You may resume regular meals tomorrow.  Today it is better to start with liquids and gradually work up to solid foods.  You may eat anything you prefer, but it is better to start with liquids, then soup and crackers, and gradually work up to solid foods.   3) Please notify your doctor immediately if you have any unusual bleeding, trouble breathing, redness and pain at the surgery site, drainage, fever, or pain not relieved by medication.    4) Additional Instructions:  See interscalene nerve block instructions as reviewed   Please contact your physician with any problems or Same Day Surgery at [phone], Monday through Friday 6 am to 4 pm, or [facility] at [facility] Main number at [phone].

## 2017-11-09 NOTE — Anesthesia Preprocedure Evaluation (Addendum)
Anesthesia Evaluation  Patient identified by MRN, date of birth, ID band Patient awake    Reviewed: Allergy & Precautions, NPO status , Patient's Chart, lab work & pertinent test results  History of Anesthesia Complications Negative for: history of anesthetic complications  Airway Mallampati: II       Dental   Pulmonary neg sleep apnea, neg COPD, former smoker,           Cardiovascular hypertension, Pt. on medications (-) Past MI and (-) CHF (-) dysrhythmias (-) Valvular Problems/Murmurs     Neuro/Psych neg Seizures    GI/Hepatic Neg liver ROS, GERD  Medicated,  Endo/Other  neg diabetes  Renal/GU negative Renal ROS     Musculoskeletal   Abdominal   Peds  Hematology   Anesthesia Other Findings   Reproductive/Obstetrics                            Anesthesia Physical Anesthesia Plan  ASA: II  Anesthesia Plan: General   Post-op Pain Management: GA combined w/ Regional for post-op pain   Induction: Intravenous  PONV Risk Score and Plan: 2 and Dexamethasone and Ondansetron  Airway Management Planned: Oral ETT  Additional Equipment:   Intra-op Plan:   Post-operative Plan:   Informed Consent: I have reviewed the patients History and Physical, chart, labs and discussed the procedure including the risks, benefits and alternatives for the proposed anesthesia with the patient or authorized representative who has indicated his/her understanding and acceptance.     Plan Discussed with:   Anesthesia Plan Comments:         Anesthesia Quick Evaluation

## 2017-11-09 NOTE — H&P (Signed)
Paper H&P to be scanned into permanent record. H&P reviewed and patient re-examined. No changes. 

## 2017-11-19 IMAGING — MR MR SHOULDER*R* W/O CM
5 series · 40 of 40 positions shown · non-contrast
Comparison: None.

CLINICAL DATA: Right shoulder pain for 2 months.  No known injury.

EXAM:
MRI OF THE RIGHT SHOULDER WITHOUT CONTRAST
TECHNIQUE: Multiplanar, multisequence MR imaging of the shoulder was performed.
No intravenous contrast was administered.

[Series 3: T2 fat-sat · axial · 4.0mm · 0.47mm/px · z∈[-35,+80]mm · 8 of 27 slices shown (1 of 3)]
[im 1/27]
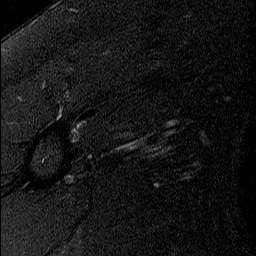
[im 4/27]
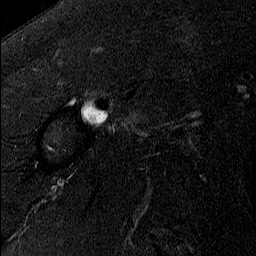
[im 8/27]
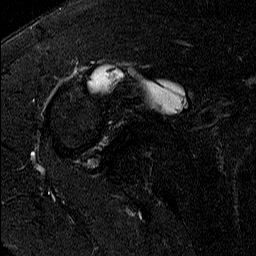
[im 12/27]
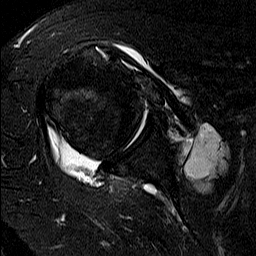
[im 15/27]
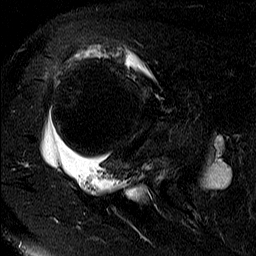
[im 19/27]
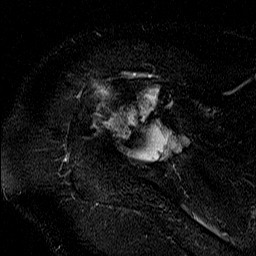
[im 23/27]
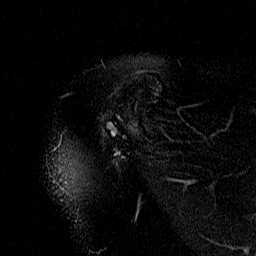
[im 27/27]
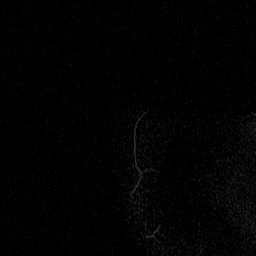

[Series 5: T2 fat-sat · oblique · 4.0mm · 0.62mm/px · 7 of 25 slices shown (2 of 3)]
[im 1/25]
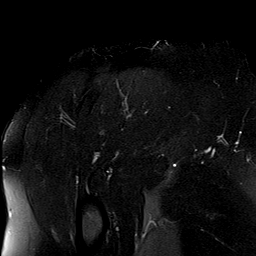
[im 5/25]
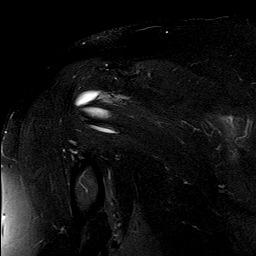
[im 9/25]
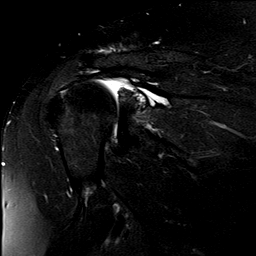
[im 13/25]
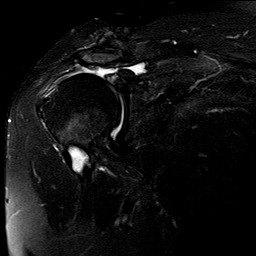
[im 17/25]
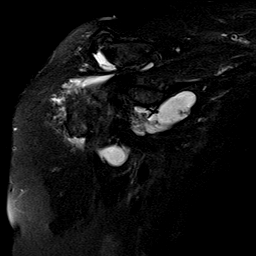
[im 21/25]
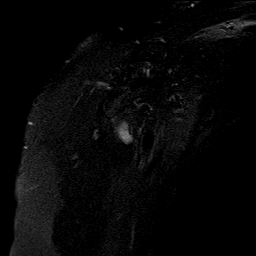
[im 25/25]
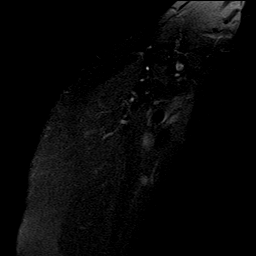

[Series 6: T1 · oblique · 4.0mm · 0.62mm/px · 9 of 29 slices shown]
[im 1/29]
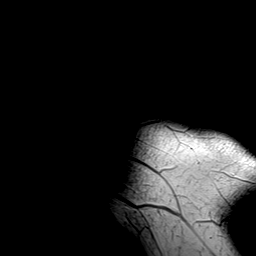
[im 4/29]
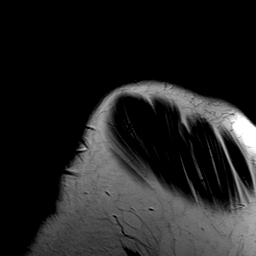
[im 8/29]
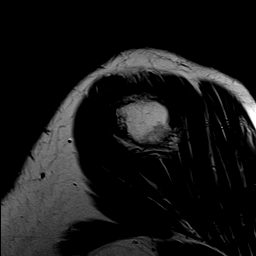
[im 11/29]
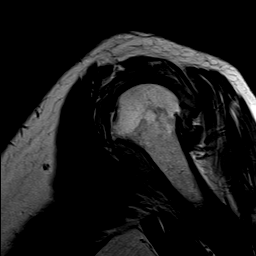
[im 15/29]
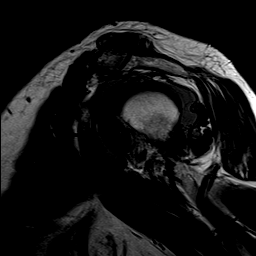
[im 18/29]
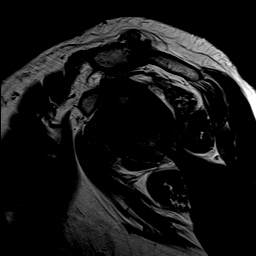
[im 22/29]
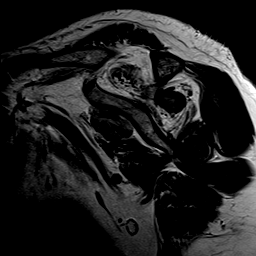
[im 25/29]
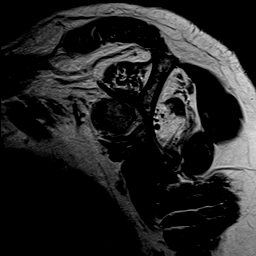
[im 29/29]
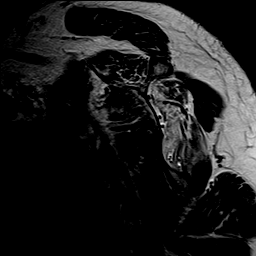

[Series 7: T2 fat-sat · oblique · 4.0mm · 0.62mm/px · 9 of 29 slices shown (3 of 3)]
[im 1/29]
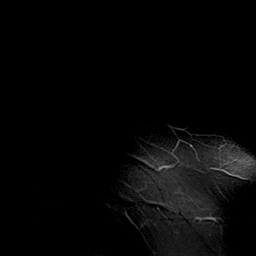
[im 4/29]
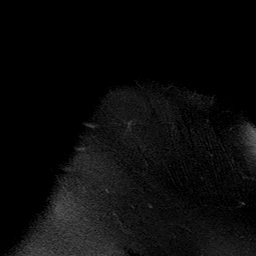
[im 8/29]
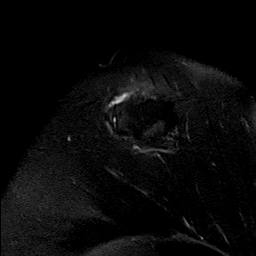
[im 11/29]
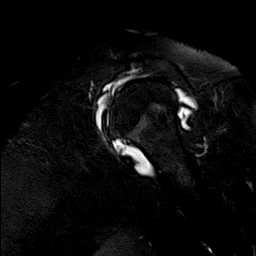
[im 15/29]
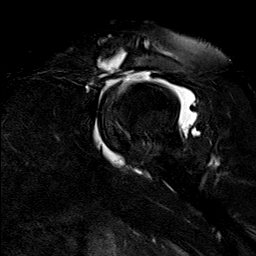
[im 18/29]
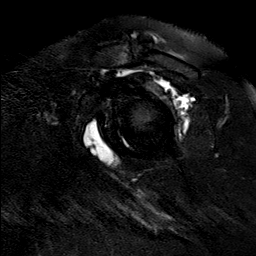
[im 22/29]
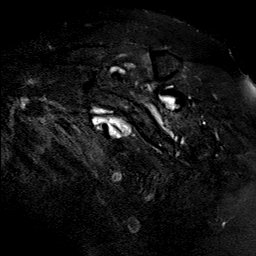
[im 25/29]
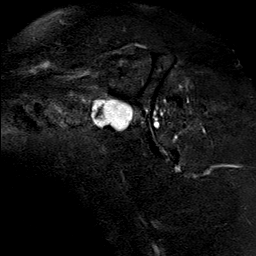
[im 29/29]
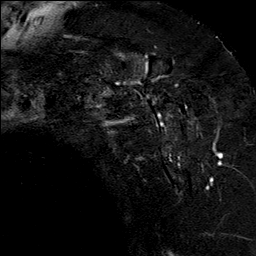

[Series 8: PD · oblique · 4.0mm · 0.62mm/px · 7 of 25 slices shown]
[im 1/25]
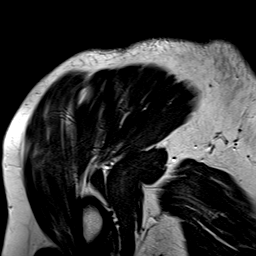
[im 5/25]
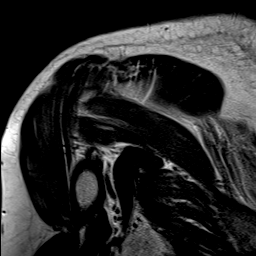
[im 9/25]
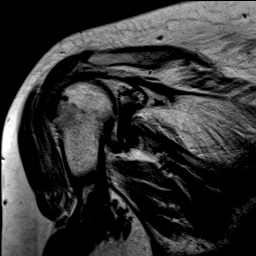
[im 13/25]
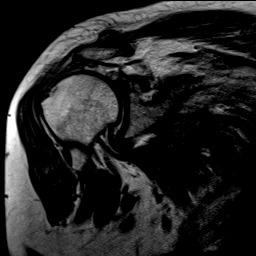
[im 17/25]
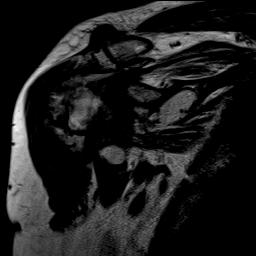
[im 21/25]
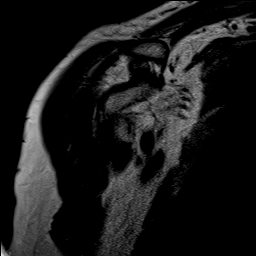
[im 25/25]
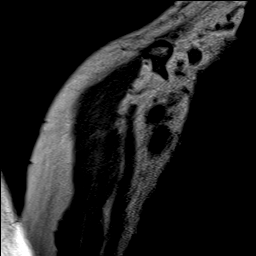

[40 of 40 positions shown; findings below may reference images not displayed]

FINDINGS: Rotator cuff: The patient has a complete supraspinatus tendon tear
with retraction medial to the glenoid, 5-6 cm. The patient also has
a near complete tear of the infraspinatus. Approximately 1.2 cm of
the central aspect of the tendon are intact. Full-thickness tears
are seen anterior and posterior to this intact band. Retraction is
to the glenoid, 5-6 cm. Also seen is a partial width, full-thickness
tear of the subscapularis involving approximately the superior
cm of the tendon with retraction of 2-3 cm. Inferior to this
full-thickness component there is a large split tear.

Muscles: Marked fatty atrophy of the infraspinatus is identified.
Moderate fatty atrophy of the subscapularis and supraspinatus is
seen.

Biceps long head: Severe tendinopathy is identified in both the
intra and extra-articular segments. The tendon is medially subluxed
out of the bicipital groove into the patient's split tear of the
subscapularis.

Acromioclavicular Joint: Bulky degenerative change is present. Type
2 acromion. A large volume of fluid is seen in the
subacromial/subdeltoid bursa.

Glenohumeral Joint: Mild degenerative change is seen.

Labrum:  The superior labrum is severely degenerated.

Bones:  No fracture or worrisome lesion.

Other: None.
IMPRESSION: Complete supraspinatus tear with 5-6 cm of retraction and mild to
moderate atrophy. Extensive tearing of the subscapularis and
infraspinatus is also identified as described above. There is marked
atrophy of the infraspinatus.

Severe tendinopathy of the intra-articular and extra-articular long
head of biceps. The tendon is medially subluxed into the patient's
longitudinal split tear of the subscapularis.

Bulky acromioclavicular osteoarthritis.

Subacromial/subdeltoid bursitis.

## 2018-12-10 ENCOUNTER — Other Ambulatory Visit: Payer: Self-pay | Admitting: Podiatry

## 2018-12-10 ENCOUNTER — Encounter: Payer: Self-pay | Admitting: Podiatry

## 2018-12-10 ENCOUNTER — Ambulatory Visit (INDEPENDENT_AMBULATORY_CARE_PROVIDER_SITE_OTHER): Payer: Medicare Other

## 2018-12-10 ENCOUNTER — Ambulatory Visit: Payer: Medicare Other | Admitting: Podiatry

## 2018-12-10 VITALS — BP 152/80 | HR 72

## 2018-12-10 DIAGNOSIS — M722 Plantar fascial fibromatosis: Secondary | ICD-10-CM | POA: Diagnosis not present

## 2018-12-10 MED ORDER — MELOXICAM 15 MG PO TABS: 15.0000 mg | ORAL_TABLET | Freq: Every day | ORAL | 0 refills | Status: DC

## 2018-12-10 NOTE — Addendum Note (Signed)
Addended byMaury Dus, Moniqua Engebretsen L on: 12/10/2018 03:41 PM   Modules accepted: Orders

## 2018-12-10 NOTE — Progress Notes (Signed)
This patient presents the office with chief complaint of a painful left foot.  He states that his left foot has been painful for approximately 2 weeks.  He says that he has no history of trauma or injury to the foot.  He says that he experiences pain in his left foot upon rising in the morning and standing from a sitting position.  He says that there is minimal pain today after the weekend.  He has provided no self treatment for this condition.  He presents the office today for an evaluation and treatment of his painful left foot.  Vascular  Dorsalis pedis and posterior tibial pulses are palpable  B/L.  Capillary return  WNL.  Temperature gradient is  WNL.  Skin turgor  WNL  Sensorium  Senn Weinstein monofilament wire  WNL. Normal tactile sensation.  Nail Exam  Patient has normal nails with no evidence of bacterial or fungal infection.  Orthopedic  Exam  Muscle tone and muscle strength  WNL.  No limitations of motion feet  B/L.  No crepitus or joint effusion noted.  Foot type is unremarkable and digits show no abnormalities.  Limited hallux limitus 1st MPJ  B/L.  Palpable pain through arch and heel left foot.  Skin  No open lesions.  Normal skin texture and turgor.  Plantar fasciitis left foot.  IE.  X-rays were taken and reveal calcification at the insertion of the plantar fascia left heel and calcification at the insertion of the Achilles tendon left heel.  Dorsal crowning noted at the first MPJ left.  Discussed this condition with this patient.  Recommended he wear power step insoles in an effort to help to support and stabilize his foot.  He was also prescribed Mobic and told to discontinue taking Aleve.  Return to the clinic in 3 weeks for further evaluation and treatment.  Helane Gunther DPM

## 2019-01-07 ENCOUNTER — Encounter: Payer: Self-pay | Admitting: Podiatry

## 2019-01-07 ENCOUNTER — Ambulatory Visit: Payer: Medicare Other | Admitting: Podiatry

## 2019-01-07 DIAGNOSIS — M722 Plantar fascial fibromatosis: Secondary | ICD-10-CM | POA: Diagnosis not present

## 2019-01-07 NOTE — Progress Notes (Signed)
This patient presents the office with chief complaint of a painful left foot.  He was diagnosed with plantar fasciitis left foot last visit.  He presents the office today stating that his foot is 60% improved and he believes it is due to the icing and stretching and power step insole.  He says he has discontinued Mobic since he felt it was not helping.  He presents the office today for continued evaluation and treatment of his left foot  Vascular  Dorsalis pedis and posterior tibial pulses are palpable  B/L.  Capillary return  WNL.  Temperature gradient is  WNL.  Skin turgor  WNL  Sensorium  Senn Weinstein monofilament wire  WNL. Normal tactile sensation.  Nail Exam  Patient has normal nails with no evidence of bacterial or fungal infection.  Orthopedic  Exam  Muscle tone and muscle strength  WNL.  No limitations of motion feet  B/L.  No crepitus or joint effusion noted.  Foot type is unremarkable and digits show no abnormalities.  Limited hallux limitus 1st MPJ  B/L.  Palpable pain through arch and heel left foot.  Skin  No open lesions.  Normal skin texture and turgor.  Plantar fasciitis left foot.  ROV.  Discussed further treatment for his plantar fasciitis with this patient.  Told him to continue stretching and icing his foot and wear the power step insoles.  Injection therapy in his left heel.  Injection therapy using 1.0 cc. Of 2% xylocaine( 20 mg.) plus 1 cc. of kenalog-la ( 10 mg) plus 1/2 cc. of dexamethazone phosphate ( 2 mg).  Patient to the return to the office in 3 weeks for further evaluation and treatment.  Helane Gunther DPM

## 2019-03-09 ENCOUNTER — Other Ambulatory Visit: Payer: Self-pay | Admitting: Podiatry

## 2019-12-20 ENCOUNTER — Other Ambulatory Visit: Payer: Self-pay

## 2019-12-20 ENCOUNTER — Ambulatory Visit: Payer: Medicare PPO | Attending: Internal Medicine

## 2019-12-20 DIAGNOSIS — Z23 Encounter for immunization: Secondary | ICD-10-CM

## 2019-12-20 NOTE — Progress Notes (Signed)
   Covid-19 Vaccination Clinic  Name:  Kurt Trujillo    MRN: [id number] DOB: 02/07/1951  12/20/2019  [name] was observed post Covid-19 immunization for 15 minutes without incidence. He was provided with Vaccine Information Sheet and instruction to access the V-Safe system.   [name] was instructed to call 911 with any severe reactions post vaccine: Marland Kitchen Difficulty breathing  . Swelling of your face and throat  . A fast heartbeat  . A bad rash all over your body  . Dizziness and weakness    Immunizations Administered    Name Date Dose VIS Date Route   Moderna COVID-19 Vaccine 12/20/2019 11:04 AM 0.5 mL 10/08/2019 Intramuscular   Manufacturer: Moderna   Lot: [id number]   NDC: 85501-586-82

## 2020-01-20 ENCOUNTER — Ambulatory Visit: Payer: Medicare PPO | Attending: Internal Medicine

## 2020-01-20 DIAGNOSIS — Z23 Encounter for immunization: Secondary | ICD-10-CM

## 2020-01-20 NOTE — Progress Notes (Signed)
   Covid-19 Vaccination Clinic  Name:  DEMONTRAE GILBERT    MRN: [id number] DOB: 1951/02/03  01/20/2020  Mr. Vanderschaaf was observed post Covid-19 immunization for 15 minutes without incident. He was provided with Vaccine Information Sheet and instruction to access the V-Safe system.   Mr. Balsam was instructed to call 911 with any severe reactions post vaccine: Marland Kitchen Difficulty breathing  . Swelling of face and throat  . A fast heartbeat  . A bad rash all over body  . Dizziness and weakness   Immunizations Administered    Name Date Dose VIS Date Route   Moderna COVID-19 Vaccine 01/20/2020 10:57 AM 0.5 mL 10/08/2019 Intramuscular   Manufacturer: Moderna   Lot: [id number]   NDC: 83358-251-89
